# Patient Record
Sex: Male | Born: 1970 | Race: White | Hispanic: No | Marital: Married | Smoking: Current every day smoker
Health system: Southern US, Community
[De-identification: ages and names within clinical notes are randomized; demographics above are authoritative.]

## PROBLEM LIST (undated history)

## (undated) DIAGNOSIS — Z8572 Personal history of non-Hodgkin lymphomas: Secondary | ICD-10-CM

## (undated) DIAGNOSIS — C801 Malignant (primary) neoplasm, unspecified: Secondary | ICD-10-CM

## (undated) DIAGNOSIS — E039 Hypothyroidism, unspecified: Secondary | ICD-10-CM

## (undated) DIAGNOSIS — C73 Malignant neoplasm of thyroid gland: Secondary | ICD-10-CM

## (undated) DIAGNOSIS — F419 Anxiety disorder, unspecified: Secondary | ICD-10-CM

## (undated) HISTORY — DX: Malignant neoplasm of thyroid gland: C73

## (undated) HISTORY — DX: Anxiety disorder, unspecified: F41.9

## (undated) HISTORY — DX: Personal history of non-Hodgkin lymphomas: Z85.72

## (undated) HISTORY — DX: Hypothyroidism, unspecified: E03.9

---

## 1993-05-26 DIAGNOSIS — C73 Malignant neoplasm of thyroid gland: Secondary | ICD-10-CM

## 1993-05-26 HISTORY — DX: Malignant neoplasm of thyroid gland: C73

## 1993-05-26 HISTORY — PX: THYROIDECTOMY: SHX17

## 1997-09-13 ENCOUNTER — Ambulatory Visit (HOSPITAL_COMMUNITY): Admission: RE | Admit: 1997-09-13 | Discharge: 1997-09-13 | Payer: Self-pay | Admitting: Endocrinology

## 1997-09-18 ENCOUNTER — Ambulatory Visit (HOSPITAL_COMMUNITY): Admission: RE | Admit: 1997-09-18 | Discharge: 1997-09-18 | Payer: Self-pay | Admitting: Endocrinology

## 1998-09-03 ENCOUNTER — Ambulatory Visit (HOSPITAL_COMMUNITY): Admission: RE | Admit: 1998-09-03 | Discharge: 1998-09-03 | Payer: Self-pay | Admitting: Endocrinology

## 1998-09-05 ENCOUNTER — Ambulatory Visit (HOSPITAL_COMMUNITY): Admission: RE | Admit: 1998-09-05 | Discharge: 1998-09-05 | Payer: Self-pay | Admitting: Endocrinology

## 1998-09-05 ENCOUNTER — Encounter: Payer: Self-pay | Admitting: Endocrinology

## 1998-09-07 ENCOUNTER — Encounter: Payer: Self-pay | Admitting: Endocrinology

## 1998-10-10 ENCOUNTER — Ambulatory Visit (HOSPITAL_COMMUNITY): Admission: RE | Admit: 1998-10-10 | Discharge: 1998-10-10 | Payer: Self-pay | Admitting: *Deleted

## 1998-10-10 ENCOUNTER — Encounter: Payer: Self-pay | Admitting: *Deleted

## 1998-10-24 ENCOUNTER — Inpatient Hospital Stay (HOSPITAL_COMMUNITY): Admission: RE | Admit: 1998-10-24 | Discharge: 1998-10-26 | Payer: Self-pay | Admitting: *Deleted

## 1998-12-24 ENCOUNTER — Ambulatory Visit (HOSPITAL_COMMUNITY): Admission: RE | Admit: 1998-12-24 | Discharge: 1998-12-24 | Payer: Self-pay | Admitting: Endocrinology

## 1998-12-27 ENCOUNTER — Encounter: Payer: Self-pay | Admitting: Endocrinology

## 2000-01-21 ENCOUNTER — Ambulatory Visit (HOSPITAL_COMMUNITY): Admission: RE | Admit: 2000-01-21 | Discharge: 2000-01-21 | Payer: Self-pay | Admitting: *Deleted

## 2000-01-21 ENCOUNTER — Encounter: Payer: Self-pay | Admitting: *Deleted

## 2000-03-23 ENCOUNTER — Encounter: Payer: Self-pay | Admitting: Endocrinology

## 2000-03-23 ENCOUNTER — Ambulatory Visit (HOSPITAL_COMMUNITY): Admission: RE | Admit: 2000-03-23 | Discharge: 2000-03-23 | Payer: Self-pay | Admitting: Endocrinology

## 2000-03-27 ENCOUNTER — Ambulatory Visit (HOSPITAL_COMMUNITY): Admission: RE | Admit: 2000-03-27 | Discharge: 2000-03-27 | Payer: Self-pay | Admitting: *Deleted

## 2000-12-28 ENCOUNTER — Ambulatory Visit (HOSPITAL_COMMUNITY): Admission: RE | Admit: 2000-12-28 | Discharge: 2000-12-28 | Payer: Self-pay | Admitting: Endocrinology

## 2001-01-01 ENCOUNTER — Encounter: Payer: Self-pay | Admitting: Endocrinology

## 2001-01-01 ENCOUNTER — Ambulatory Visit (HOSPITAL_COMMUNITY): Admission: RE | Admit: 2001-01-01 | Discharge: 2001-01-01 | Payer: Self-pay | Admitting: Endocrinology

## 2002-01-03 ENCOUNTER — Encounter (HOSPITAL_COMMUNITY): Admission: RE | Admit: 2002-01-03 | Discharge: 2002-01-07 | Payer: Self-pay | Admitting: Endocrinology

## 2002-01-07 ENCOUNTER — Encounter: Payer: Self-pay | Admitting: Endocrinology

## 2003-12-18 ENCOUNTER — Encounter (HOSPITAL_COMMUNITY): Admission: RE | Admit: 2003-12-18 | Discharge: 2004-03-17 | Payer: Self-pay | Admitting: Endocrinology

## 2004-05-14 ENCOUNTER — Other Ambulatory Visit: Admission: RE | Admit: 2004-05-14 | Discharge: 2004-05-14 | Payer: Self-pay | Admitting: Otolaryngology

## 2004-08-02 ENCOUNTER — Ambulatory Visit (HOSPITAL_COMMUNITY): Admission: RE | Admit: 2004-08-02 | Discharge: 2004-08-02 | Payer: Self-pay | Admitting: Otolaryngology

## 2004-08-02 ENCOUNTER — Encounter (INDEPENDENT_AMBULATORY_CARE_PROVIDER_SITE_OTHER): Payer: Self-pay | Admitting: Specialist

## 2004-09-04 ENCOUNTER — Ambulatory Visit (HOSPITAL_COMMUNITY): Admission: RE | Admit: 2004-09-04 | Discharge: 2004-09-04 | Payer: Self-pay | Admitting: Endocrinology

## 2006-07-14 ENCOUNTER — Ambulatory Visit (HOSPITAL_COMMUNITY): Admission: RE | Admit: 2006-07-14 | Discharge: 2006-07-14 | Payer: Self-pay | Admitting: Endocrinology

## 2007-02-25 ENCOUNTER — Emergency Department (HOSPITAL_COMMUNITY): Admission: EM | Admit: 2007-02-25 | Discharge: 2007-02-25 | Payer: Self-pay | Admitting: Emergency Medicine

## 2009-08-26 ENCOUNTER — Emergency Department (HOSPITAL_COMMUNITY)
Admission: EM | Admit: 2009-08-26 | Discharge: 2009-08-26 | Payer: Self-pay | Source: Home / Self Care | Admitting: Family Medicine

## 2010-07-10 ENCOUNTER — Other Ambulatory Visit (HOSPITAL_COMMUNITY): Payer: Self-pay | Admitting: Endocrinology

## 2010-07-10 DIAGNOSIS — C799 Secondary malignant neoplasm of unspecified site: Secondary | ICD-10-CM

## 2010-07-10 DIAGNOSIS — C73 Malignant neoplasm of thyroid gland: Secondary | ICD-10-CM

## 2010-07-24 ENCOUNTER — Encounter (HOSPITAL_COMMUNITY): Payer: Self-pay

## 2010-07-24 ENCOUNTER — Encounter (HOSPITAL_COMMUNITY)
Admission: RE | Admit: 2010-07-24 | Discharge: 2010-07-24 | Disposition: A | Discharge status: Home / Self Care | Payer: 59 | Source: Ambulatory Visit | Attending: Endocrinology | Admitting: Endocrinology

## 2010-07-24 DIAGNOSIS — C73 Malignant neoplasm of thyroid gland: Secondary | ICD-10-CM | POA: Insufficient documentation

## 2010-07-24 DIAGNOSIS — C799 Secondary malignant neoplasm of unspecified site: Secondary | ICD-10-CM

## 2010-07-24 DIAGNOSIS — Z79899 Other long term (current) drug therapy: Secondary | ICD-10-CM | POA: Insufficient documentation

## 2010-07-24 HISTORY — DX: Malignant (primary) neoplasm, unspecified: C80.1

## 2010-07-24 MED ORDER — FLUDEOXYGLUCOSE F - 18 (FDG) INJECTION
17.7000 | Freq: Once | INTRAVENOUS | Status: AC | PRN
  Administered 2010-07-24: 17.7 via INTRAVENOUS

## 2010-07-25 LAB — GLUCOSE, CAPILLARY: Glucose-Capillary: 125 mg/dL — ABNORMAL HIGH (ref 70–99)

## 2011-03-11 IMAGING — PT NM PET TUM IMG RESTAG (PS) SKULL BASE T - THIGH
6 series · 25 of 25 positions shown · non-contrast
Comparison: 07/14/2006

CLINICAL DATA: Subsequent treatment strategy for papillary thyroid
cancer.

NUCLEAR MEDICINE PET CT INITIAL (PI) SKULL BASE TO THIGH
TECHNIQUE: 17.7 mCi F-18 FDG was injected intravenously via the
right hand.  Full-ring PET imaging was performed from the skull
base through the mid-thighs 65  minutes after injection.  CT data
was obtained and used for attenuation correction and anatomic
localization only.  (This was not acquired as a diagnostic CT
examination.)
Fasting Blood Glucose:  125

[Series 1: pet ac · axial · 3.3mm · 4.69mm/px · z∈[-882,-12]mm · 5 of 267 slices shown]
[im 1/267]
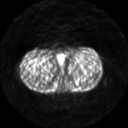
[im 67/267]
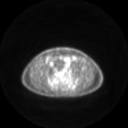
[im 134/267]
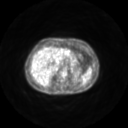
[im 200/267]
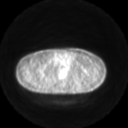
[im 267/267]
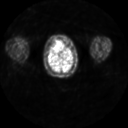

[Series 2: ct images · axial · 3.8mm · 0.98mm/px · z∈[-882,-12]mm · 6 of 267 slices shown]
[im 1/267]
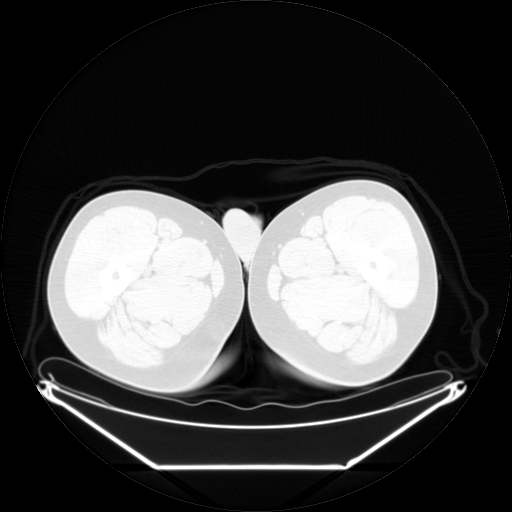
[im 54/267]
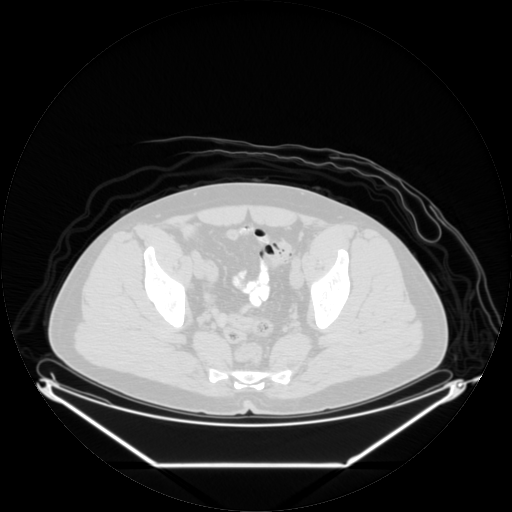
[im 107/267]
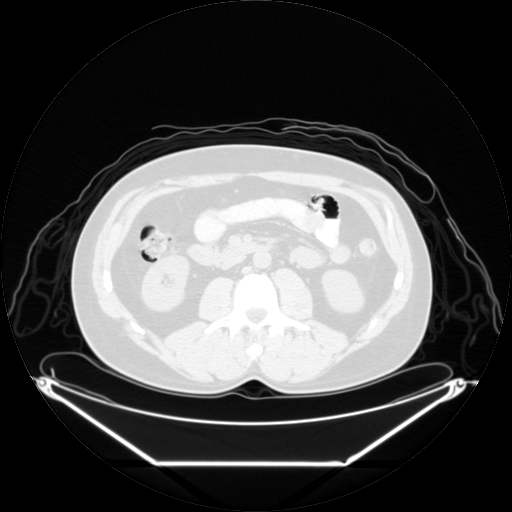
[im 160/267]
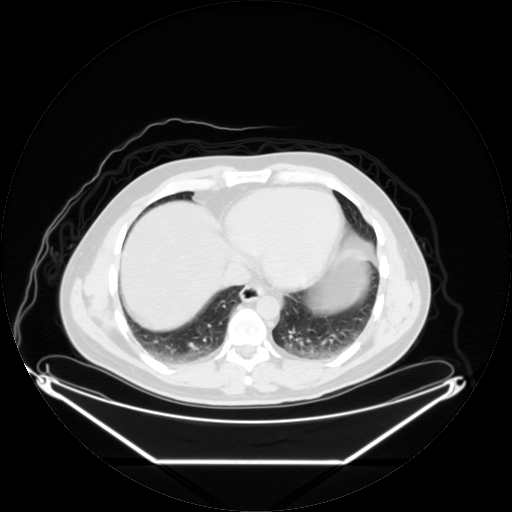
[im 213/267]
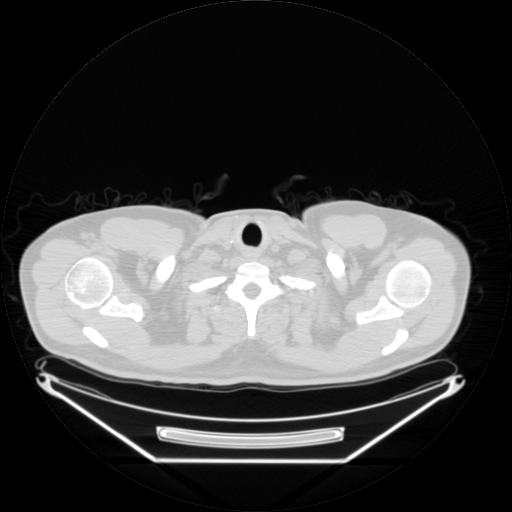
[im 267/267  brain]
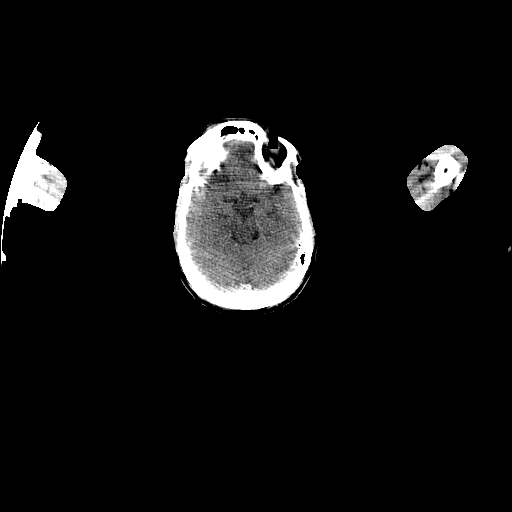

[Series 2: pet nac · axial · 3.3mm · 4.69mm/px · z∈[-882,-12]mm · 6 of 267 slices shown]
[im 1/267]
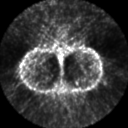
[im 54/267]
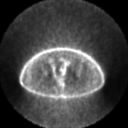
[im 107/267]
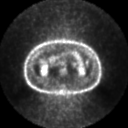
[im 160/267]
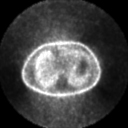
[im 213/267]
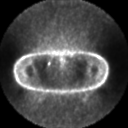
[im 267/267]
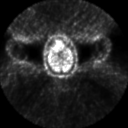

[Series 123: mip · coronal · 3.3mm · 4.69mm/px · 1 of 30 slices shown]
[im 1/30]
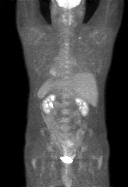

[Series 151: reformatted · axial · 3.3mm · 3.91mm/px · z∈[-882,-12]mm · 6 of 267 slices shown (1 of 2)]
[im 1/267]
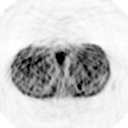
[im 54/267]
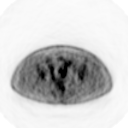
[im 107/267]
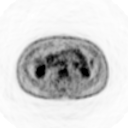
[im 160/267]
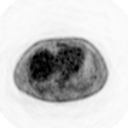
[im 213/267]
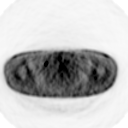
[im 267/267]
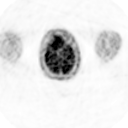

[Series 153: reformatted · coronal · 4.7mm · 6.98mm/px · 1 of 70 slices shown (2 of 2)]
[im 1/70]
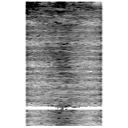

[25 of 25 positions shown; findings below may reference images not displayed]

FINDINGS: There is moderate asymmetric mucosal thickening involving
the left maxillary sinus.

Right supraclavicular lymph node is identified and exhibits
increased FDG uptake.  This measures 0.8 cm and has an SUV max
equal to 4.0.  On the previous examination this lymph node measured
0.8 cm and had an SUV max equal to 2.9.

Within the superior mediastinum there is a small pretracheal lymph
node which measures 0.9 cm.  This lymph node exhibits moderate
increased uptake with an SUV max equal to 5.5.  Previously this
lymph node measures 1.1 cm and had an SUV max equal to the 3.6.

No additional hypermetabolic mediastinal lymph nodes.  There are no
hypermetabolic hilar lymph nodes.

Negative for pericardial or pleural effusion.

The lungs are clear.

There is no abnormal FDG uptake within the liver.

There is no splenic uptake.

The pancreas appears normal.

Both adrenal glands are normal.

Normal appearance of the kidneys.

No upper abdominal adenopathy.

No hypermetabolic pelvic or inguinal lymph nodes.

There is no abnormal FDG uptake identified within the visualized
axial or appendicular skeleton.
IMPRESSION: 1.  Right supraclavicular and superior mediastinal lymph nodes are
again noted and continue to exhibit increased FDG uptake.  The
degree of FDG uptake is mildly increased in the interval.
2.  No new sites of metastatic disease.

## 2011-08-01 ENCOUNTER — Other Ambulatory Visit: Payer: Self-pay | Admitting: Orthopedic Surgery

## 2011-08-01 DIAGNOSIS — M5126 Other intervertebral disc displacement, lumbar region: Secondary | ICD-10-CM

## 2011-08-01 DIAGNOSIS — M4800 Spinal stenosis, site unspecified: Secondary | ICD-10-CM

## 2017-09-29 ENCOUNTER — Encounter: Payer: Self-pay | Admitting: Gastroenterology

## 2017-10-28 ENCOUNTER — Encounter: Payer: Self-pay | Admitting: Gastroenterology

## 2017-10-29 ENCOUNTER — Encounter: Payer: Self-pay | Admitting: Gastroenterology

## 2017-10-29 ENCOUNTER — Ambulatory Visit (INDEPENDENT_AMBULATORY_CARE_PROVIDER_SITE_OTHER): Payer: 59 | Admitting: Gastroenterology

## 2017-10-29 ENCOUNTER — Other Ambulatory Visit (INDEPENDENT_AMBULATORY_CARE_PROVIDER_SITE_OTHER): Payer: 59

## 2017-10-29 VITALS — BP 126/78 | HR 63 | Ht 72.0 in | Wt 191.0 lb

## 2017-10-29 DIAGNOSIS — K625 Hemorrhage of anus and rectum: Secondary | ICD-10-CM

## 2017-10-29 DIAGNOSIS — R197 Diarrhea, unspecified: Secondary | ICD-10-CM | POA: Diagnosis not present

## 2017-10-29 LAB — CBC WITH DIFFERENTIAL/PLATELET
BASOS PCT: 0.7 % (ref 0.0–3.0)
Basophils Absolute: 0 10*3/uL (ref 0.0–0.1)
EOS PCT: 4.1 % (ref 0.0–5.0)
Eosinophils Absolute: 0.2 10*3/uL (ref 0.0–0.7)
HEMATOCRIT: 44.9 % (ref 39.0–52.0)
HEMOGLOBIN: 15.5 g/dL (ref 13.0–17.0)
LYMPHS PCT: 28.6 % (ref 12.0–46.0)
Lymphs Abs: 1.5 10*3/uL (ref 0.7–4.0)
MCHC: 34.4 g/dL (ref 30.0–36.0)
MCV: 88.2 fl (ref 78.0–100.0)
MONOS PCT: 7.8 % (ref 3.0–12.0)
Monocytes Absolute: 0.4 10*3/uL (ref 0.1–1.0)
Neutro Abs: 3.1 10*3/uL (ref 1.4–7.7)
Neutrophils Relative %: 58.8 % (ref 43.0–77.0)
Platelets: 177 10*3/uL (ref 150.0–400.0)
RBC: 5.1 Mil/uL (ref 4.22–5.81)
RDW: 13.6 % (ref 11.5–15.5)
WBC: 5.3 10*3/uL (ref 4.0–10.5)

## 2017-10-29 NOTE — Progress Notes (Signed)
Chief Complaint: Rectal bleeding  Referring Provider:  Dr Jannette Fogo      ASSESSMENT AND PLAN;   #1. Rectal Bleeding - Differential diagnosis includes hemorrhoids, AVMs, colitis, polyps, rule out colonic neoplasms or IBD.   #2. IBS with H/O diarrhea. Neg C. Diff and stool studies.  Synthroid being adjusted.    - Proceed with colonoscopy.  I have discussed the risks and benefits.  The risks including risk of perforation requiring laparotomy, bleeding after polypectomy requiring blood transfusions and risks of anesthesia/sedation were discussed.  Rare risks of missing colorectal neoplasms were also discussed.  Alternatives were given.  Patient is fully aware and agrees to proceed. All the questions were answered. Colonoscopy will be scheduled in upcoming days.  Patient is to report immediately if there is any significant weight loss or excessive bleeding until then. Consent forms were given for review.  #3. Weight Loss. -Check weight every week and bring recordings to the next visit.  Patient is to report only if there is continued weight loss. If continued weight loss, will proceed with CT scan of the abdomen and pelvis with p.o. and IV contrast. I have discussed above in detail with the patient.  All the contact numbers were given.  Patient knows how to get in touch with [place].   HPI:    Christopher Atkins is a 47 y.o. male  C/o Diarrhea - 2-4/day, after eating, since 1995 ever since thyroid Sx, being closely followed by endocrinologist.  Synthroid is still being adjusted.  He does admit that he would forget to take it at times. Diarrhea gets worse with anxiety.  No nocturnal symptoms.  Stool studies have been negative.  Has history of taking clindamycin after oral surgery which did make diarrhea worse.  C. difficile was negative. Occ rectal bleeding intermittently over the last 6 months, at times mixed with the stool, some perirectal itching.  No nonsteroidals Lost weight 10lb over the last 3  months but his weight does fluctuate. No nausea, vomiting, heartburn, regurgitation, odynophagia or dysphagia.  No significant  constipation.  There is no melena. Has history of non-Hodgkin's lymphoma status post lymph node resection -no chemoradiation. Has history of thyroid cancer in 1995 status post total thyroidectomy     Past Medical History:  Diagnosis Date  . Anxiety   . Cancer ([place])   . Hyperlipidemia   . Hypothyroidism   . Personal history of non-Hodgkin lymphomas   . Thyroid cancer ([place]) 1995    Past Surgical History:  Procedure Laterality Date  . THYROIDECTOMY due to thyroid cancer  1995    Family History  Problem Relation Age of Onset  . Colon cancer Neg Hx     Social History   Tobacco Use  . Smoking status: Current Every Day Smoker    Packs/day: 0.50  . Smokeless tobacco: Never Used  Substance Use Topics  . Alcohol use: Not Currently  . Drug use: Never    Current Outpatient Medications  Medication Sig Dispense Refill  . citalopram (CELEXA) 40 MG tablet Take 40 mg by mouth daily.    Marland Kitchen levothyroxine (SYNTHROID, LEVOTHROID) 175 MCG tablet Take 175 mcg by mouth daily before breakfast.     No current facility-administered medications for this visit.     Allergies  Allergen Reactions  . Gadolinium Derivatives Other (See Comments)    Patient goes unconscious    Review of Systems:  Constitutional: Denies fever, chills, diaphoresis, appetite change and fatigue.  HEENT: Denies photophobia, eye  pain, redness, hearing loss, ear pain, congestion, sore throat, rhinorrhea, sneezing, mouth sores, neck pain, neck stiffness and tinnitus.   Respiratory: Denies SOB, DOE, cough, chest tightness,  and wheezing.   Cardiovascular: Denies chest pain, palpitations and leg swelling.  Genitourinary: Denies dysuria, urgency, frequency, hematuria, flank pain and difficulty urinating.  Musculoskeletal: Denies myalgias, back pain, joint swelling, arthralgias and gait problem.    Skin: No rash.  Neurological: Denies dizziness, seizures, syncope, weakness, light-headedness, numbness and headaches.  Hematological: Denies adenopathy. Easy bruising, personal or family bleeding history  Psychiatric/Behavioral: Has anxiety or depression. Applying for disability.      Physical Exam:    BP 126/78   Pulse 63   Ht 6' (1.829 m)   Wt 191 lb (86.6 kg)   BMI 25.90 kg/m  Filed Weights   10/29/17 0913  Weight: 191 lb (86.6 kg)   Constitutional:  Well-developed, in no acute distress. Psychiatric: Normal mood and affect. Behavior is normal. HEENT: Pupils normal.  Conjunctivae are normal. No scleral icterus. Neck supple.  Cardiovascular: Normal rate, regular rhythm. No edema Pulmonary/chest: Effort normal and breath sounds normal. No wheezing, rales or rhonchi. Abdominal: Soft, nondistended. Nontender. Bowel sounds active throughout. There are no masses palpable. No hepatomegaly. Rectal:  To be done at time of colon Neurological: Alert and oriented to person place and time. Skin: Skin is warm and dry. No rashes noted.   Radiology Studies: No results found.    Carmell Austria, MD 10/29/2017, 9:23 AM  Cc: Dr Jannette Fogo

## 2017-10-29 NOTE — Patient Instructions (Signed)
If you are age 49 or older, your body mass index should be between 23-30. Your Body mass index is 25.9 kg/m. If this is out of the aforementioned range listed, please consider follow up with your Primary Care Provider.  If you are age 47 or younger, your body mass index should be between 19-25. Your Body mass index is 25.9 kg/m. If this is out of the aformentioned range listed, please consider follow up with your Primary Care Provider.   You have been scheduled for a colonoscopy. Please follow written instructions given to you at your visit today.  Please pick up your prep supplies at the pharmacy within the next 1-3 days. If you use inhalers (even only as needed), please bring them with you on the day of your procedure. Your physician has requested that you go to www.startemmi.com and enter the access code given to you at your visit today. This web site gives a general overview about your procedure. However, you should still follow specific instructions given to you by our office regarding your preparation for the procedure.  Please stop at the lab before you leave the office today.   Thank you,  Dr. [name]

## 2017-11-02 ENCOUNTER — Encounter: Payer: Self-pay | Admitting: Gastroenterology

## 2017-11-02 ENCOUNTER — Other Ambulatory Visit: Payer: Self-pay

## 2017-11-02 ENCOUNTER — Ambulatory Visit (AMBULATORY_SURGERY_CENTER): Payer: 59 | Admitting: Gastroenterology

## 2017-11-02 ENCOUNTER — Encounter: Payer: 59 | Admitting: Gastroenterology

## 2017-11-02 VITALS — BP 113/73 | HR 60 | Temp 98.7°F | Resp 16 | Ht 72.0 in | Wt 191.0 lb

## 2017-11-02 DIAGNOSIS — K625 Hemorrhage of anus and rectum: Secondary | ICD-10-CM

## 2017-11-02 DIAGNOSIS — K921 Melena: Secondary | ICD-10-CM | POA: Diagnosis not present

## 2017-11-02 DIAGNOSIS — R197 Diarrhea, unspecified: Secondary | ICD-10-CM

## 2017-11-02 MED ORDER — SODIUM CHLORIDE 0.9 % IV SOLN
500.0000 mL | Freq: Once | INTRAVENOUS | Status: AC
Start: 2017-11-02 — End: ?

## 2017-11-02 NOTE — Op Note (Signed)
[facility] Patient Name: Christopher Atkins Procedure Date: 11/02/2017 3:18 PM MRN: [id number] Endoscopist: [name] , MD Age: 47 Referring MD:  Date of Birth: [birth year] Gender: Male Account #: [id number] Procedure:                Colonoscopy Indications:              Clinically significant diarrhea of unexplained                            origin, Rectal bleeding Medicines:                Monitored Anesthesia Care Procedure:                Pre-Anesthesia Assessment:                           - Prior to the procedure, a History and Physical                            was performed, and patient medications and                            allergies were reviewed. The patient is competent.                            The risks and benefits of the procedure and the                            sedation options and risks were discussed with the                            patient. All questions were answered and informed                            consent was obtained. Patient identification and                            proposed procedure were verified by the physician                            in the procedure room. Mental Status Examination:                            alert and oriented. Prophylactic Antibiotics: The                            patient does not require prophylactic antibiotics.                            Prior Anticoagulants: The patient has taken no                            previous anticoagulant or antiplatelet agents. ASA  Grade Assessment: I - A normal, healthy patient.                            After reviewing the risks and benefits, the patient                            was deemed in satisfactory condition to undergo the                            procedure. The anesthesia plan was to use monitored                            anesthesia care (MAC). Immediately prior to                            administration of medications,  the patient was                            re-assessed for adequacy to receive sedatives. The                            heart rate, respiratory rate, oxygen saturations,                            blood pressure, adequacy of pulmonary ventilation,                            and response to care were monitored throughout the                            procedure. The physical status of the patient was                            re-assessed after the procedure.                           After obtaining informed consent, the colonoscope                            was passed under direct vision. Throughout the                            procedure, the patient's blood pressure, pulse, and                            oxygen saturations were monitored continuously. The                            Colonoscope was introduced through the anus and                            advanced to the 4 cm into the ileum. The  colonoscopy was performed without difficulty. The                            patient tolerated the procedure well. The quality                            of the bowel preparation was good. Scope In: 3:26:14 PM Scope Out: 3:37:45 PM Scope Withdrawal Time: 0 hours 9 minutes 32 seconds  Total Procedure Duration: 0 hours 11 minutes 31 seconds  Findings:                 The colon (entire examined portion) appeared                            normal. Biopsies for histology were taken with a                            cold forceps from the entire colon for evaluation                            of microscopic colitis. The TI was normal. Biopsies                            were taken with a cold forceps for histology from                            terminal ileum.                           Non-bleeding internal hemorrhoids were found during                            retroflexion. The hemorrhoids were moderate and                            Grade II (internal hemorrhoids  that prolapse but                            reduce spontaneously).                           The entire examined colon appeared normal on direct                            and retroflexion views. Complications:            No immediate complications. Estimated Blood Loss:     Estimated blood loss: none. Impression:               - Internal hemorrhoids.                           - Otherwise normal to TI. Recommendation:           - Patient has a contact number available for  emergencies. The signs and symptoms of potential                            delayed complications were discussed with the                            patient. Return to normal activities tomorrow.                            Written discharge instructions were provided to the                            patient.                           - Resume previous diet.                           - Continue present medications.                           - Await pathology results.                           - Return to GI office PRN.                           - If still with diarrhea, trial of benty. If he has                            any ano-rectal problems, consider prep H 1 bid pr x                            7-10 days. [name], MD 11/02/2017 3:49:20 PM This report has been signed electronically.

## 2017-11-02 NOTE — Progress Notes (Signed)
I asked Dr. Lyndel Safe if he wanted me to send in Bentyl rx.  Per Dr. Lyndel Safe, no waiting for bx results and then will decide.  Pt to try OTC Preparartion H suppositories BID for 7-10 days per Dr. Lyndel Safe.  maw

## 2017-11-02 NOTE — Progress Notes (Signed)
Called to room to assist during endoscopic procedure.  Patient ID and intended procedure confirmed with present staff. Received instructions for my participation in the procedure from the performing physician.  

## 2017-11-02 NOTE — Progress Notes (Signed)
A/ox3 pleased with MAC, report to RN 

## 2017-11-02 NOTE — Progress Notes (Signed)
No problems noted in the recovery room. maw 

## 2017-11-02 NOTE — Patient Instructions (Signed)
YOU HAD AN ENDOSCOPIC PROCEDURE TODAY AT [facility]:   Refer to the procedure report that was given to you for any specific questions about what was found during the examination.  If the procedure report does not answer your questions, please call your gastroenterologist to clarify.  If you requested that your care partner not be given the details of your procedure findings, then the procedure report has been included in a sealed envelope for you to review at your convenience later.  YOU SHOULD EXPECT: Some feelings of bloating in the abdomen. Passage of more gas than usual.  Walking can help get rid of the air that was put into your GI tract during the procedure and reduce the bloating. If you had a lower endoscopy (such as a colonoscopy or flexible sigmoidoscopy) you may notice spotting of blood in your stool or on the toilet paper. If you underwent a bowel prep for your procedure, you may not have a normal bowel movement for a few days.  Please Note:  You might notice some irritation and congestion in your nose or some drainage.  This is from the oxygen used during your procedure.  There is no need for concern and it should clear up in a day or so.  SYMPTOMS TO REPORT IMMEDIATELY:   Following lower endoscopy (colonoscopy or flexible sigmoidoscopy):  Excessive amounts of blood in the stool  Significant tenderness or worsening of abdominal pains  Swelling of the abdomen that is new, acute  Fever of 100F or higher   For urgent or emergent issues, a gastroenterologist can be reached at any hour by calling [phone].   DIET:  We do recommend a small meal at first, but then you may proceed to your regular diet.  Drink plenty of fluids but you should avoid alcoholic beverages for 24 hours.  ACTIVITY:  You should plan to take it easy for the rest of today and you should NOT DRIVE or use heavy machinery until tomorrow (because of the sedation medicines used during the test).     FOLLOW UP: Our staff will call the number listed on your records the next business day following your procedure to check on you and address any questions or concerns that you may have regarding the information given to you following your procedure. If we do not reach you, we will leave a message.  However, if you are feeling well and you are not experiencing any problems, there is no need to return our call.  We will assume that you have returned to your regular daily activities without incident.  If any biopsies were taken you will be contacted by phone or by letter within the next 1-3 weeks.  Please call us at [phone] if you have not heard about the biopsies in 3 weeks.    SIGNATURES/CONFIDENTIALITY: You and/or your care partner have signed paperwork which will be entered into your electronic medical record.  These signatures attest to the fact that that the information above on your After Visit Summary has been reviewed and is understood.  Full responsibility of the confidentiality of this discharge information lies with you and/or your care-partner.    Handout was given to your care partner on hemorrhoids. Use Preparation H Suppositories OTC - 1 supp. in the rectum morning and night for 7-10 days. You may resume your current medications today. Await biopsy results. Please call if any questions or concerns.

## 2017-11-02 NOTE — Progress Notes (Signed)
Pt's states no medical or surgical changes since previsit or office visit. 

## 2017-11-03 ENCOUNTER — Telehealth: Payer: Self-pay | Admitting: *Deleted

## 2017-11-03 NOTE — Telephone Encounter (Signed)
  Follow up Call-  Call back number 11/02/2017  Post procedure Call Back phone  # [phone]  Permission to leave phone message Yes  Some recent data might be hidden     Patient questions:  Do you have a fever, pain , or abdominal swelling? No. Pain Score  0 *  Have you tolerated food without any problems? Yes.    Have you been able to return to your normal activities? Yes.    Do you have any questions about your discharge instructions: Diet   No. Medications  No. Follow up visit  No.  Do you have questions or concerns about your Care? No.  Actions: * If pain score is 4 or above: No action needed, pain <4.

## 2017-11-06 ENCOUNTER — Encounter: Payer: Self-pay | Admitting: Gastroenterology

## 2017-11-10 ENCOUNTER — Telehealth: Payer: Self-pay

## 2017-11-10 NOTE — Telephone Encounter (Signed)
Pathology result letter sent.  10 year repeat colon recall set.

## 2018-03-22 ENCOUNTER — Telehealth: Payer: Self-pay | Admitting: Gastroenterology

## 2018-03-22 NOTE — Telephone Encounter (Signed)
Vaughan Basta,  Please set it up for any day when I am in the clinic except Friday. Thanks

## 2018-03-22 NOTE — Telephone Encounter (Signed)
Christopher Atkins, Can you please assist with this? See below

## 2018-03-22 NOTE — Telephone Encounter (Signed)
Abbey calling from Dr. Carin Hock office would like to set up a peer to peer with Dr. Lyndel Safe this week. Pls call her.

## 2018-03-22 NOTE — Telephone Encounter (Signed)
Please see previous message

## 2018-03-23 NOTE — Telephone Encounter (Signed)
They will call back tomorrow at 11:15am.
